# Patient Record
Sex: Male | Born: 1960 | Race: White | Hispanic: No | Marital: Married
Health system: Southern US, Community
[De-identification: ages and names within clinical notes are randomized; demographics above are authoritative.]

---

## 2011-08-16 ENCOUNTER — Emergency Department: Payer: Self-pay | Admitting: Emergency Medicine

## 2013-10-05 ENCOUNTER — Ambulatory Visit: Payer: Self-pay | Admitting: Cardiothoracic Surgery

## 2013-10-05 ENCOUNTER — Ambulatory Visit: Payer: Self-pay | Admitting: Family Medicine

## 2013-10-06 ENCOUNTER — Ambulatory Visit: Payer: Self-pay | Admitting: Cardiothoracic Surgery

## 2013-10-30 ENCOUNTER — Ambulatory Visit: Payer: Self-pay | Admitting: Cardiothoracic Surgery

## 2014-10-03 IMAGING — CR DG RIBS 2V*L*
1 series · 4 of 4 positions shown · non-contrast
Comparison: none

REASON FOR EXAM: pain
COMMENTS:

[Series 1: ap · 0.17mm/px · 4 of 4 slices shown]
[im 1/4]
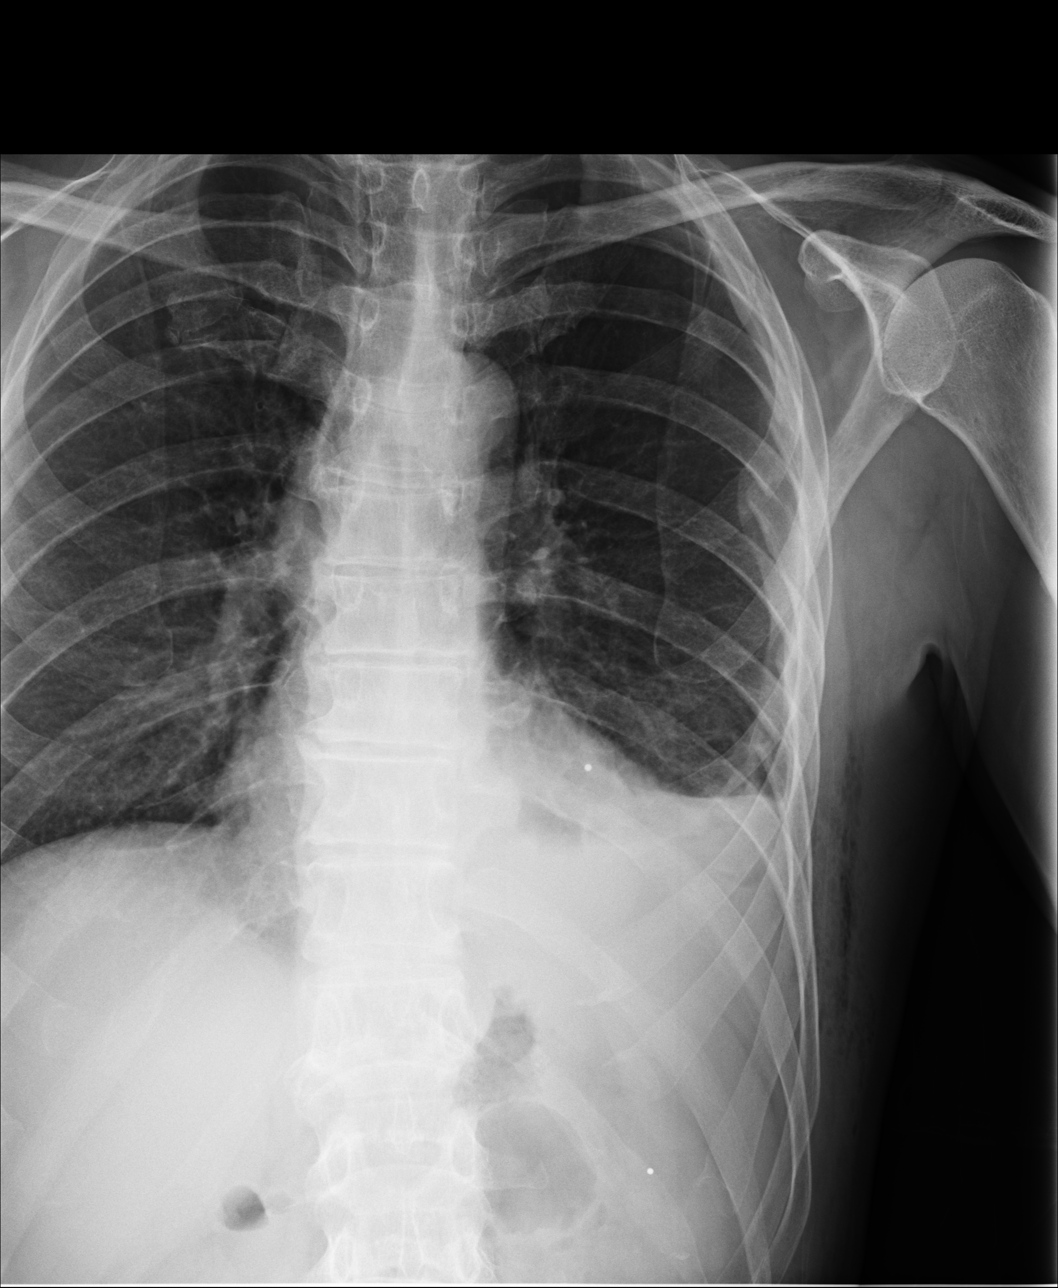
[im 2/4]
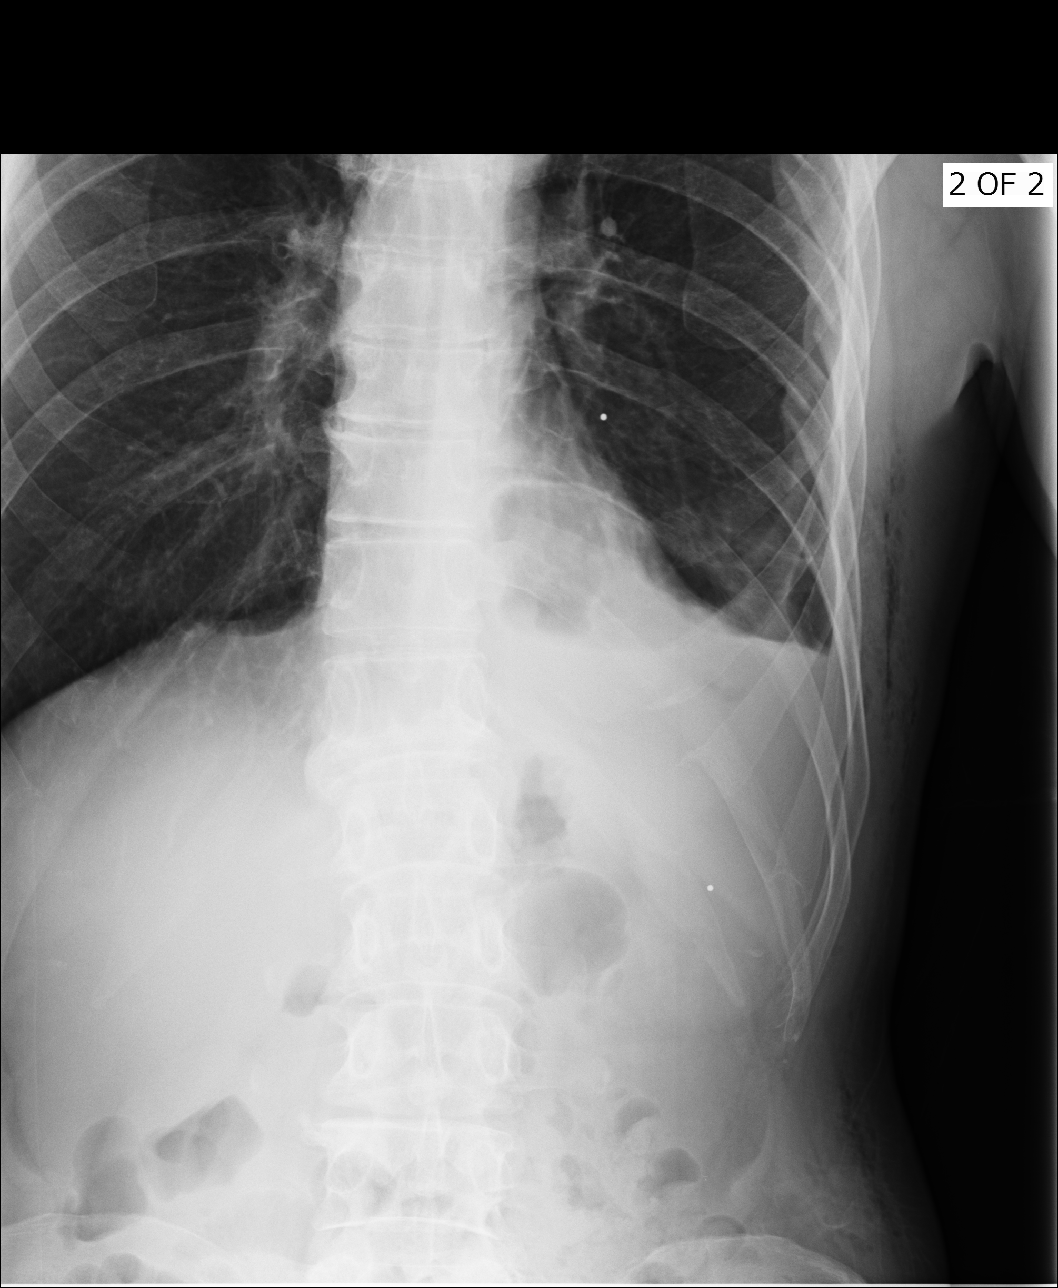
[im 3/4]
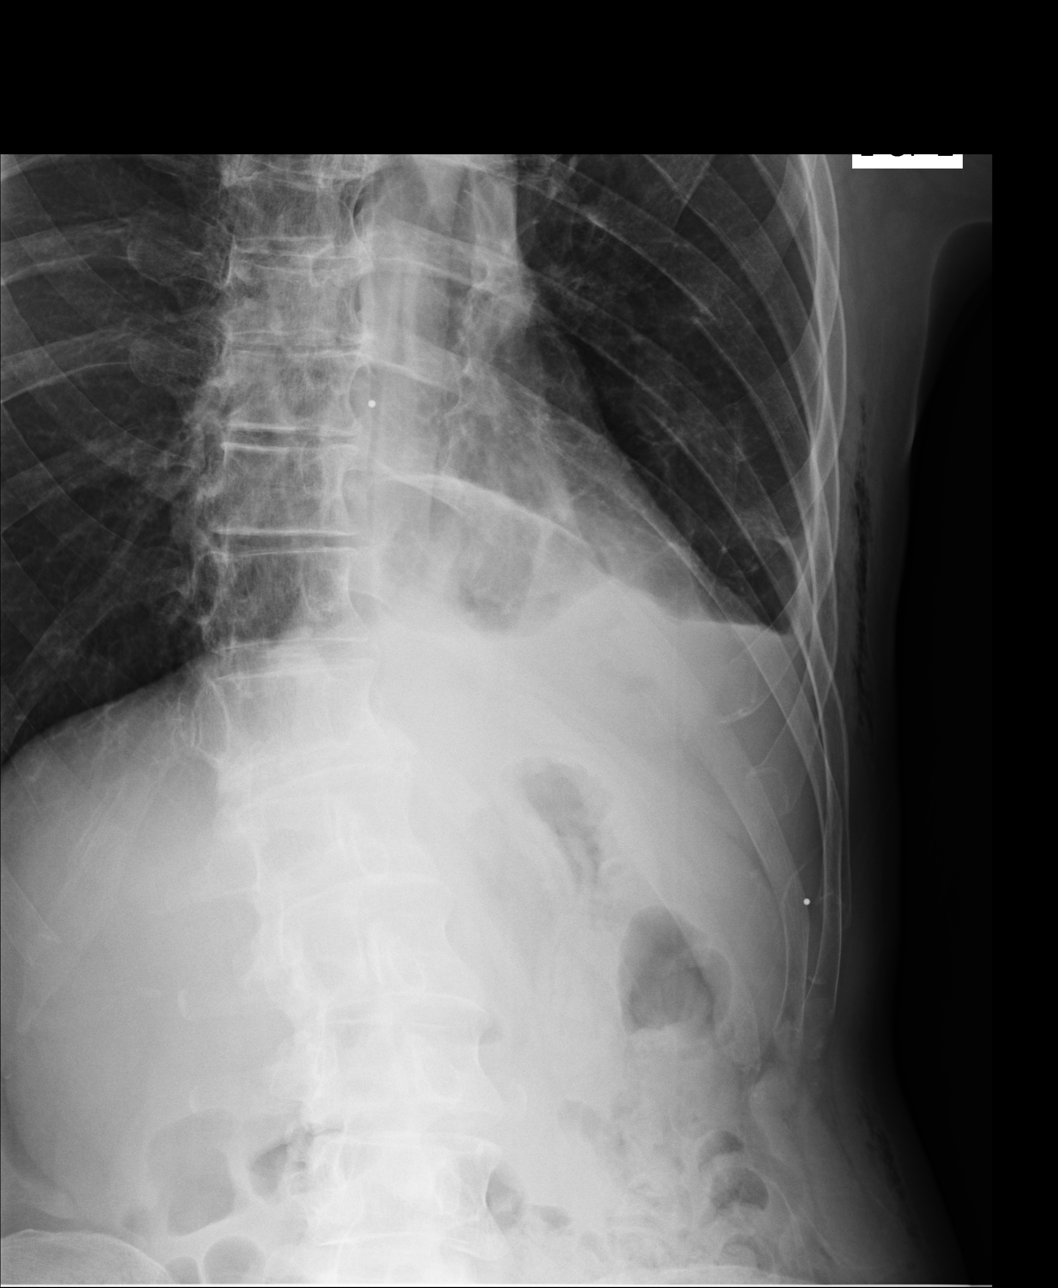
[im 4/4]
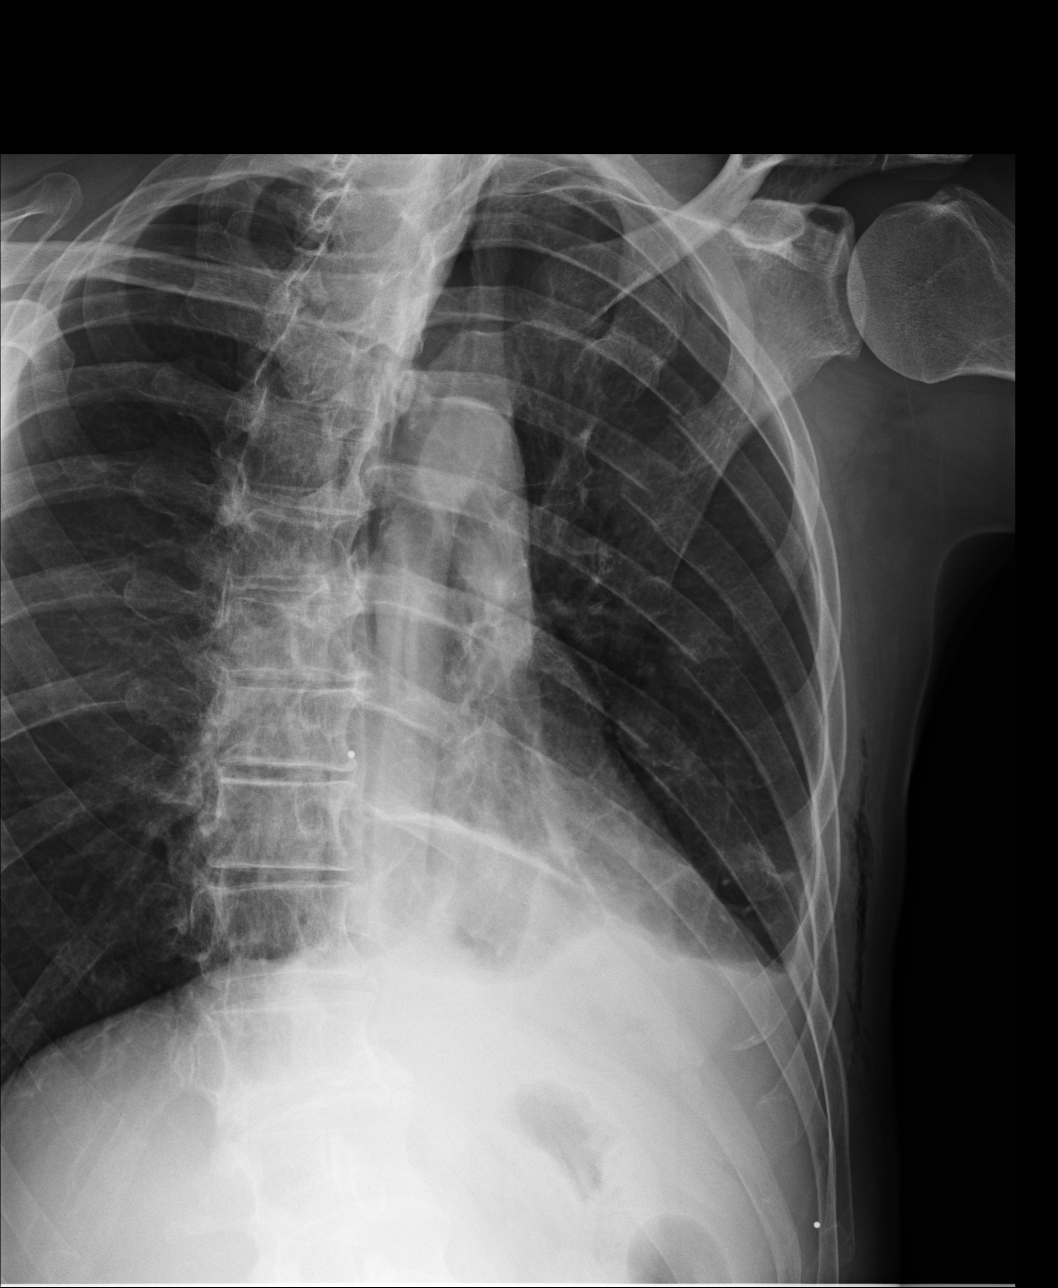

[4 of 4 positions shown; findings below may reference images not displayed]

PROCEDURE:     CLAIRE - CLAIRE RIBS LEFT UNILATERAL  - October 05, 2013 [DATE]

RESULT:     There is blunting of the left costophrenic angle. There is
evidence of a fracture in the posterior left second rib. Followup chest
x-ray is recommended to evaluate for pneumothorax. There is a fracture of
the left fifth rib posterior laterally and possibly the left sixth rib.
There is subcutaneous emphysema suggested along the left chest wall. There
is a fracture in the posterior left eighth rib.
IMPRESSION: 1. Multiple left-sided rib fractures with pleural fluid possibly secondary
to hemorrhage. Subcutaneous emphysema is present. Correlate for left
pneumothorax. Followup chest x-ray is recommended.

[REDACTED](*)

## 2014-10-26 IMAGING — CR DG CHEST 2V
1 series · 2 of 2 positions shown · non-contrast
Comparison: none

REASON FOR EXAM: F/U RIB FRACTURES
COMMENTS:

[Series 1: w chest pa · 0.14mm/px · 2 of 2 slices shown]
[im 1/2]
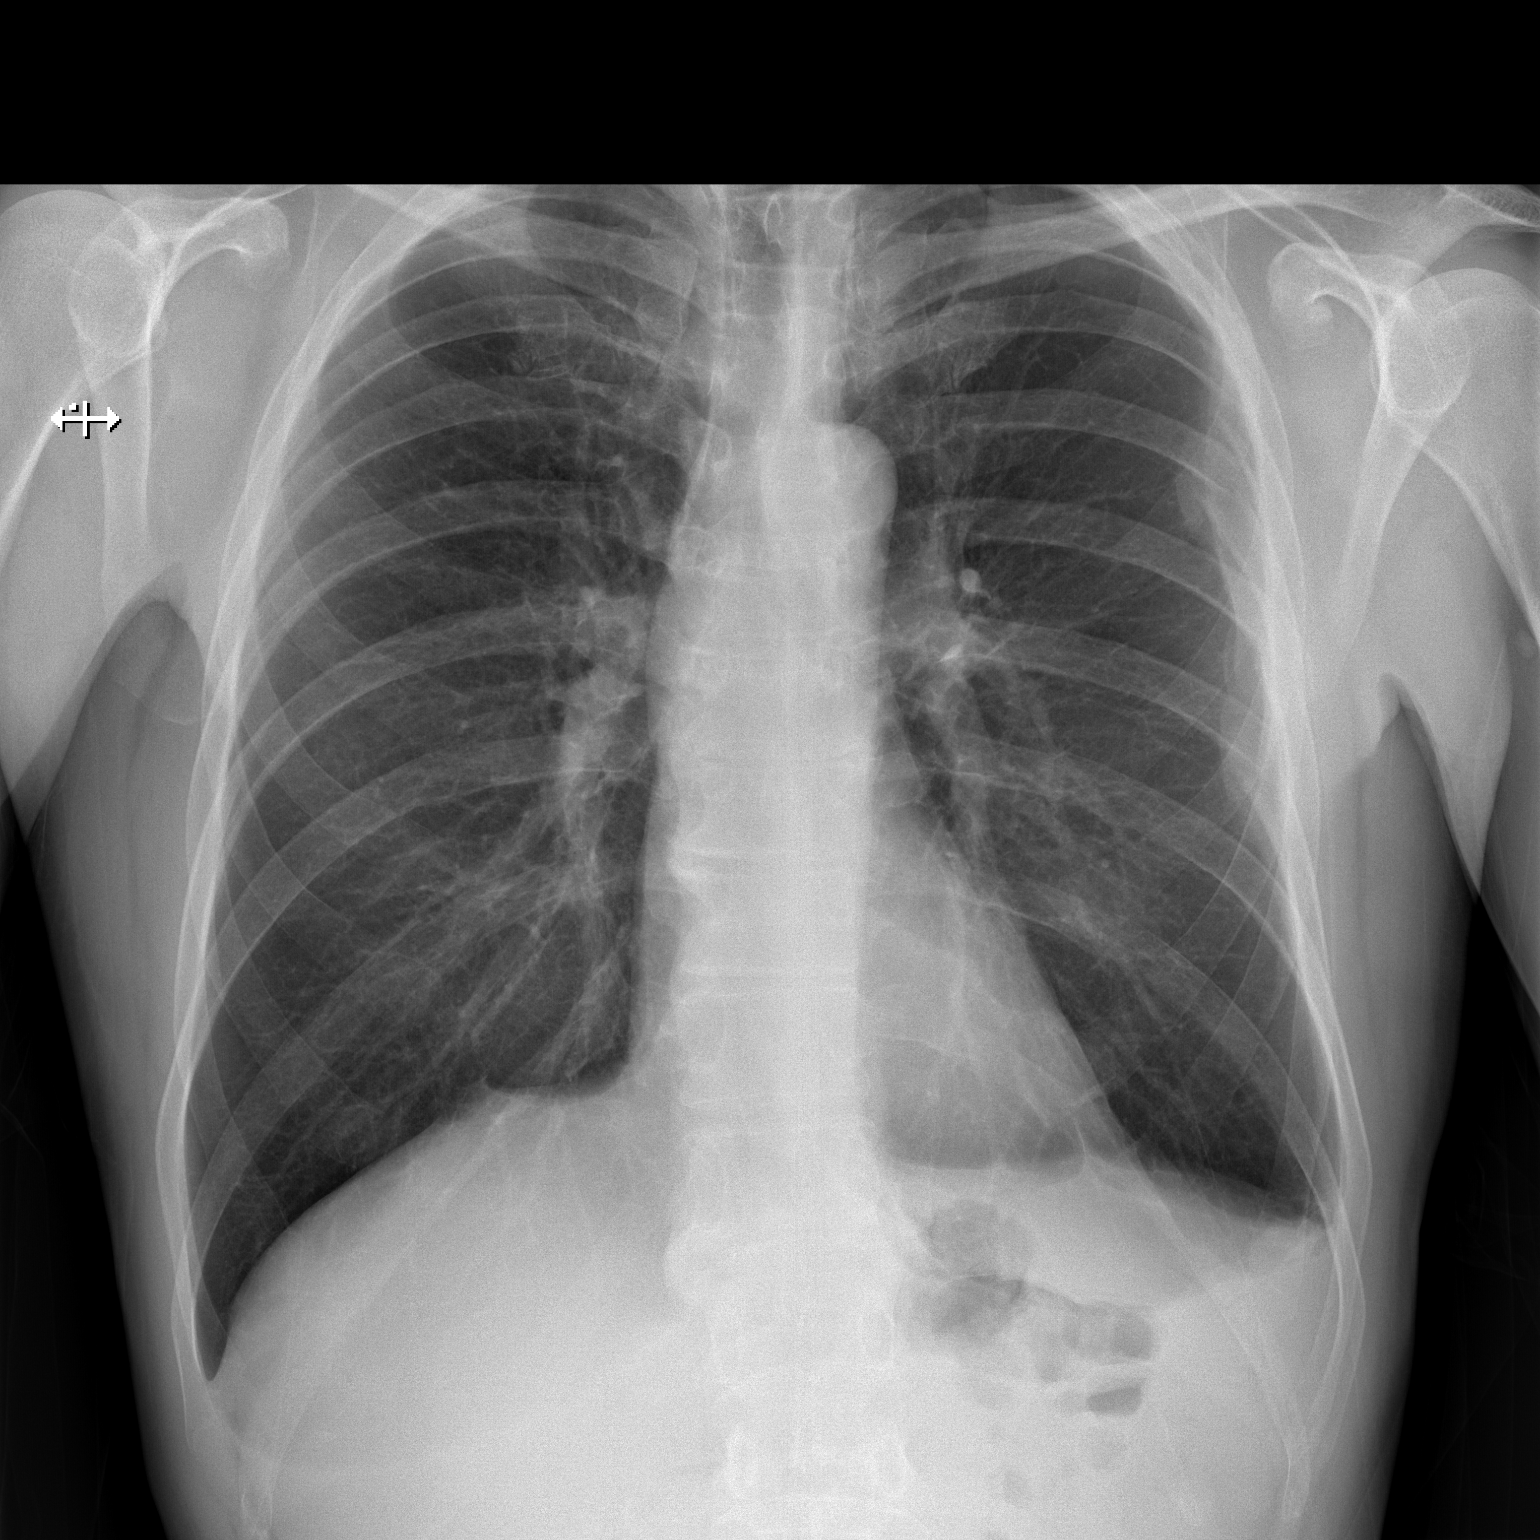
[im 2/2]
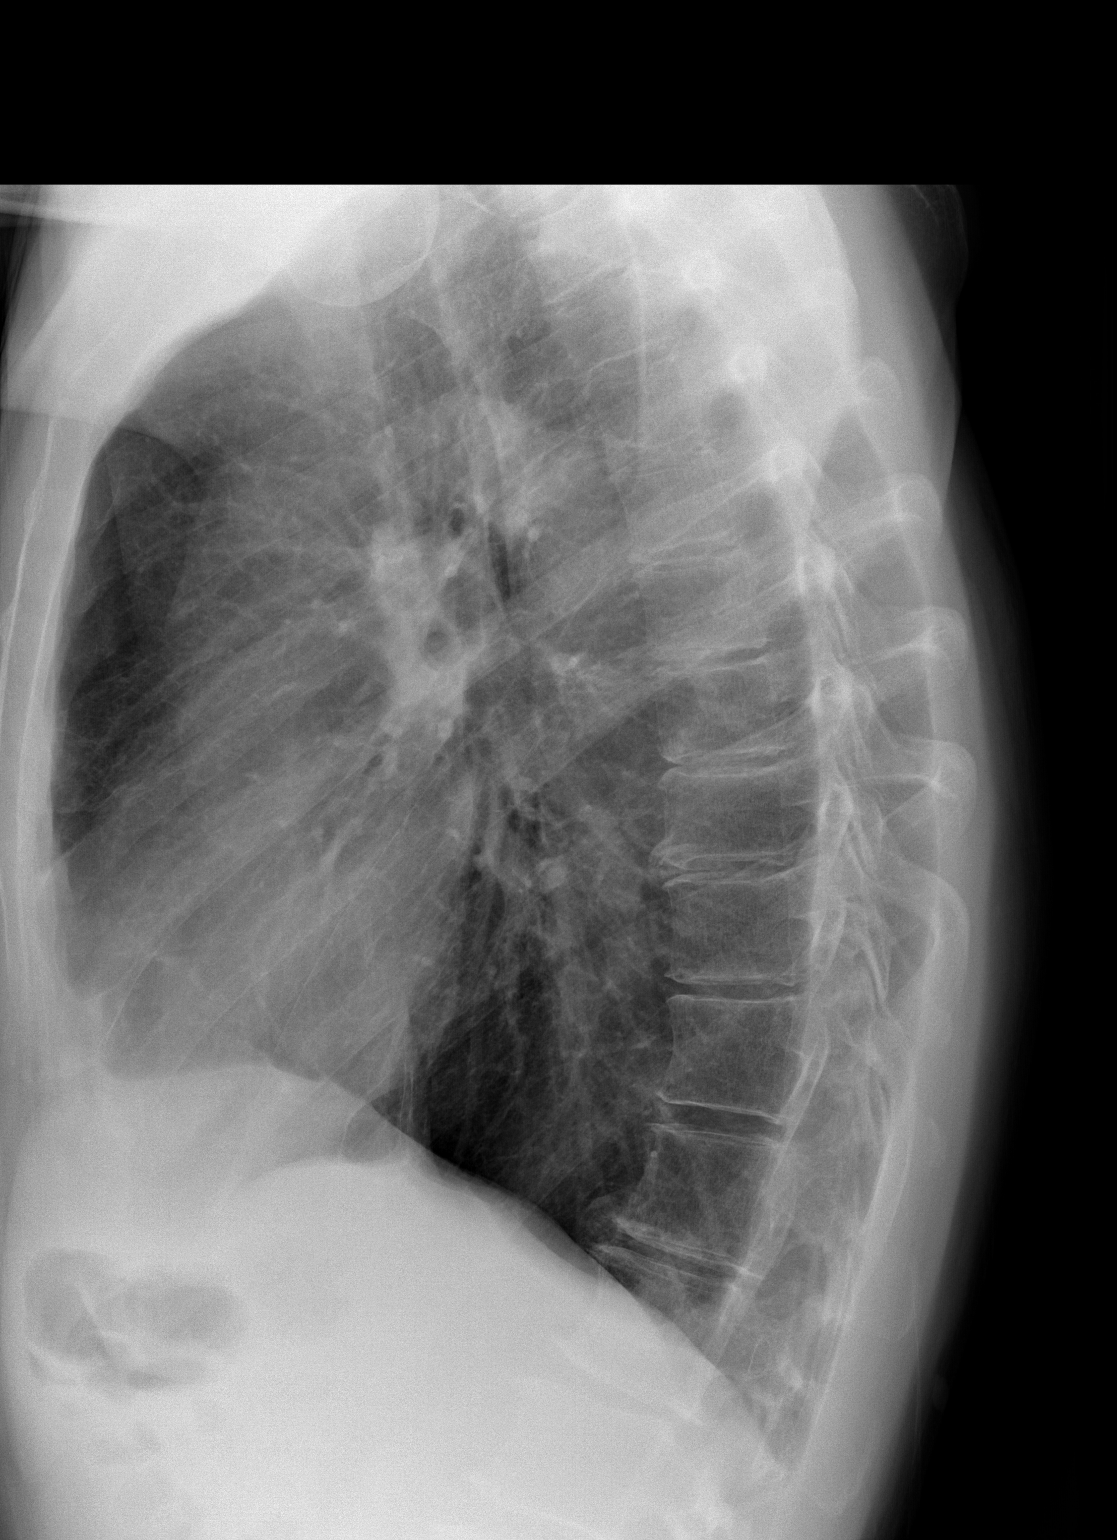

[2 of 2 positions shown; findings below may reference images not displayed]

PROCEDURE:     DXR - DXR CHEST PA (OR AP) AND LATERAL  - October 28, 2013  [DATE]

RESULT:     There is hyperinflation. The lungs are clear. The heart and
pulmonary vessels are normal. The bony and mediastinal structures are
unremarkable. There is no effusion. There is no pneumothorax or evidence of
congestive failure.
IMPRESSION: No acute cardiopulmonary disease. There is hyperinflation
which may reflect some underlying reactive airway disease or COPD.

[REDACTED]

## 2023-03-31 ENCOUNTER — Other Ambulatory Visit: Payer: Self-pay

## 2023-03-31 ENCOUNTER — Emergency Department
Admission: EM | Admit: 2023-03-31 | Discharge: 2023-04-01 | Disposition: A | Discharge status: Home / Self Care | Payer: Self-pay | Attending: Emergency Medicine | Admitting: Emergency Medicine

## 2023-03-31 DIAGNOSIS — T402X1A Poisoning by other opioids, accidental (unintentional), initial encounter: Secondary | ICD-10-CM | POA: Insufficient documentation

## 2023-03-31 MED ORDER — NALOXONE HCL 4 MG/0.1ML NA LIQD
1.0000 | Freq: Once | NASAL | Status: AC
  Administered 2023-04-01: 1 via NASAL
  Filled 2023-03-31: qty 4

## 2023-03-31 NOTE — ED Triage Notes (Signed)
Pt to ED via [facility] from home. Wife found pt unconscious in shower. Upon arrival pt was agonal breathing and purple from his chest up. Pt given 2 Narcan intranasal and 0.5 mg Narcan IV. Pt reports daughter passed away two years ago from a drug overdose, and when looking through her things today, he found a bluish pink powder and snorted it. Pt has hx of cocaine use,.

## 2023-03-31 NOTE — ED Provider Notes (Signed)
[facility] Provider Note    Event Date/Time   First MD Initiated Contact with Patient 03/31/23 2123     (approximate)   History   Drug Overdose   HPI  Jose Frazier is a 62 y.o. male past medical history of cocaine use disorder who presents after unintentional overdose.  Patient's daughter passed away from an overdose 2 years ago.  Today he was going through things when he found an unknown white powder and snorted it because he wanted to change his mood.  Denies any intent to kill himself.  The last thing he remembers is getting into the shower.  Per EMS patient's wife found him unconscious.  When they arrived she had agonal respirations and was purple from his chest up received 2 mg intranasal 1.5 IV Narcan and then had return of breathing and color.  Patient does not use opiates.  Used cocaine last last week.     No past medical history on file.  There are no problems to display for this patient.    Physical Exam  Triage Vital Signs: ED Triage Vitals  Enc Vitals Group     BP 03/31/23 2130 (!) 180/117     Pulse Rate 03/31/23 2130 89     Resp 03/31/23 2130 13     Temp 03/31/23 2130 97.8 F (36.6 C)     Temp Source 03/31/23 2130 Oral     SpO2 03/31/23 2130 99 %     Weight 03/31/23 2131 176 lb (79.8 kg)     Height 03/31/23 2131 6\' 2"  (1.88 m)     Head Circumference --      Peak Flow --      Pain Score 03/31/23 2131 0     Pain Loc --      Pain Edu? --      Excl. in [place]? --     Most recent vital signs: Vitals:   03/31/23 2130 03/31/23 2200  BP: (!) 180/117 (!) 167/109  Pulse: 89 93  Resp: 13 13  Temp: 97.8 F (36.6 C)   SpO2: 99% 96%     General: Awake, no distress.  CV:  Good peripheral perfusion.  Resp:  Normal effort.  Lung sounds are clear Abd:  No distention.  Neuro:             Awake, Alert, Oriented x 3  Other:  Tearful, no SI   ED Results / Procedures / Treatments  Labs (all labs ordered are listed, but only abnormal  results are displayed) Labs Reviewed - No data to display   EKG     RADIOLOGY    PROCEDURES:  Critical Care performed: No  .1-3 Lead EKG Interpretation  Performed by: Rada Hay, MD Authorized by: Rada Hay, MD     Interpretation: normal     ECG rate assessment: normal     Rhythm: sinus rhythm     Ectopy: none     Conduction: normal     The patient is on the cardiac monitor to evaluate for evidence of arrhythmia and/or significant heart rate changes.   MEDICATIONS ORDERED IN ED: Medications - No data to display   IMPRESSION / MDM / [place] / ED COURSE  I reviewed the triage vital signs and the nursing notes.                              Patient's  presentation is most consistent with acute presentation with potential threat to life or bodily function.  Differential diagnosis includes, but is not limited to, unintentional opiate overdose, intentional opiate overdose  Patient is a 62 year old male who presents today after an unintentional opiate overdose.  He was going through his daughter stuff, his daughter who died of an overdose about 2 years ago when he found an unknown powder and snorted it.  He was found unconscious by his wife when EMS arrived he had agonal respirations and received Narcan with response.  On arrival to ED he is alert and oriented saturating well on room air.  He says he feels sad but is not having any suicidal ideation denies suicidal intent today.  He does not use opiates typically.  He is very frustrated with himself or doing what he did.  He is hypertensive vitals otherwise reassuring lung sounds are clear he is not in any respiratory distress and has no extra subjective shortness of breath.  Will observe but I do suspect that this was a uncomplicated opiate overdose.  He is not showing any signs of post naloxone pulmonary edema or aspiration.  Will monitor.  Patient was observed for about 2 hours in the ED.   Maintained normal O2 sats and mental status.  Will discharge with naloxone in hand.       FINAL CLINICAL IMPRESSION(S) / ED DIAGNOSES   Final diagnoses:  Opioid overdose, accidental or unintentional, initial encounter     Rx / DC Orders   ED Discharge Orders     None        Note:  This document was prepared using Dragon voice recognition software and may include unintentional dictation errors.   Rada Hay, MD 03/31/23 [phone]
